# Patient Record
Sex: Female | Born: 1995 | Race: Black or African American | Hispanic: No | Marital: Single
Health system: Southern US, Community
[De-identification: ages and names within clinical notes are randomized; demographics above are authoritative.]

---

## 2019-04-20 ENCOUNTER — Emergency Department (HOSPITAL_COMMUNITY): Payer: Commercial Managed Care - PPO

## 2019-04-20 ENCOUNTER — Emergency Department (HOSPITAL_COMMUNITY)
Admission: EM | Admit: 2019-04-20 | Discharge: 2019-04-20 | Disposition: A | Discharge status: Home / Self Care | Payer: Commercial Managed Care - PPO | Attending: Emergency Medicine | Admitting: Emergency Medicine

## 2019-04-20 ENCOUNTER — Other Ambulatory Visit: Payer: Self-pay

## 2019-04-20 ENCOUNTER — Encounter (HOSPITAL_COMMUNITY): Payer: Self-pay | Admitting: Student

## 2019-04-20 DIAGNOSIS — R569 Unspecified convulsions: Secondary | ICD-10-CM | POA: Insufficient documentation

## 2019-04-20 LAB — CBC WITH DIFFERENTIAL/PLATELET
Abs Immature Granulocytes: 0 10*3/uL (ref 0.00–0.07)
Basophils Absolute: 0 10*3/uL (ref 0.0–0.1)
Basophils Relative: 1 %
Eosinophils Absolute: 0.2 10*3/uL (ref 0.0–0.5)
Eosinophils Relative: 4 %
HCT: 41.3 % (ref 36.0–46.0)
Hemoglobin: 12.6 g/dL (ref 12.0–15.0)
Lymphocytes Relative: 54 %
Lymphs Abs: 2.2 10*3/uL (ref 0.7–4.0)
MCH: 20.8 pg — ABNORMAL LOW (ref 26.0–34.0)
MCHC: 30.5 g/dL (ref 30.0–36.0)
MCV: 68 fL — ABNORMAL LOW (ref 80.0–100.0)
Monocytes Absolute: 0.2 10*3/uL (ref 0.1–1.0)
Monocytes Relative: 4 %
Neutro Abs: 1.5 10*3/uL — ABNORMAL LOW (ref 1.7–7.7)
Neutrophils Relative %: 37 %
Platelets: 255 10*3/uL (ref 150–400)
RBC: 6.07 MIL/uL — ABNORMAL HIGH (ref 3.87–5.11)
RDW: 14.6 % (ref 11.5–15.5)
WBC: 4.1 10*3/uL (ref 4.0–10.5)
nRBC: 0 % (ref 0.0–0.2)
nRBC: 0 /100 WBC

## 2019-04-20 LAB — COMPREHENSIVE METABOLIC PANEL
ALT: 14 U/L (ref 0–44)
AST: 15 U/L (ref 15–41)
Albumin: 3.8 g/dL (ref 3.5–5.0)
Alkaline Phosphatase: 39 U/L (ref 38–126)
Anion gap: 9 (ref 5–15)
BUN: 14 mg/dL (ref 6–20)
CO2: 25 mmol/L (ref 22–32)
Calcium: 9.2 mg/dL (ref 8.9–10.3)
Chloride: 107 mmol/L (ref 98–111)
Creatinine, Ser: 0.93 mg/dL (ref 0.44–1.00)
GFR calc Af Amer: >60 mL/min (ref >60)
GFR calc non Af Amer: >60 mL/min (ref >60)
Glucose, Bld: 107 mg/dL — ABNORMAL HIGH (ref 70–99)
Potassium: 3.4 mmol/L — ABNORMAL LOW (ref 3.5–5.1)
Sodium: 141 mmol/L (ref 135–145)
Total Bilirubin: 0.3 mg/dL (ref 0.3–1.2)
Total Protein: 6.3 g/dL — ABNORMAL LOW (ref 6.5–8.1)

## 2019-04-20 LAB — URINALYSIS, ROUTINE W REFLEX MICROSCOPIC
Bilirubin Urine: NEGATIVE
Glucose, UA: NEGATIVE mg/dL
Hgb urine dipstick: NEGATIVE
Ketones, ur: NEGATIVE mg/dL
Leukocytes,Ua: NEGATIVE
Nitrite: NEGATIVE
Protein, ur: NEGATIVE mg/dL
Specific Gravity, Urine: 1.014 (ref 1.005–1.030)
pH: 6 (ref 5.0–8.0)

## 2019-04-20 LAB — I-STAT BETA HCG BLOOD, ED (MC, WL, AP ONLY): I-stat hCG, quantitative: <5 m[IU]/mL (ref <5)

## 2019-04-20 LAB — RAPID URINE DRUG SCREEN, HOSP PERFORMED
Amphetamines: NOT DETECTED
Barbiturates: NOT DETECTED
Benzodiazepines: NOT DETECTED
Cocaine: NOT DETECTED
Opiates: NOT DETECTED
Tetrahydrocannabinol: NOT DETECTED

## 2019-04-20 LAB — LIPASE, BLOOD: Lipase: 25 U/L (ref 11–51)

## 2019-04-20 LAB — CBG MONITORING, ED: Glucose-Capillary: 97 mg/dL (ref 70–99)

## 2019-04-20 LAB — ETHANOL: Alcohol, Ethyl (B): <10 mg/dL (ref <10)

## 2019-04-20 MED ORDER — LEVETIRACETAM 500 MG PO TABS: 500.0000 mg | ORAL_TABLET | Freq: Two times a day (BID) | ORAL | 0 refills | Status: AC

## 2019-04-20 MED ORDER — SODIUM CHLORIDE 0.9 % IV BOLUS
1000.0000 mL | Freq: Once | INTRAVENOUS | Status: AC
  Administered 2019-04-20: 1000 mL via INTRAVENOUS

## 2019-04-20 MED ORDER — LEVETIRACETAM IN NACL 1000 MG/100ML IV SOLN
1000.0000 mg | Freq: Once | INTRAVENOUS | Status: AC
  Administered 2019-04-20: 1000 mg via INTRAVENOUS
  Filled 2019-04-20: qty 100

## 2019-04-20 NOTE — Discharge Instructions (Signed)
Follow-up with your neurosurgeon.  Do not drive a vehicle or operate heavy machinery until you are cleared by your neurosurgeon.  You need to see your primary care doctor and/or neurology likely for an EEG and MRI.  Hopefully her neurosurgeon can help set this up.

## 2019-04-20 NOTE — ED Notes (Signed)
Pt does have VP shunt

## 2019-04-20 NOTE — ED Notes (Signed)
Pt verbalized understanding of discharge instructions. Follow up care and prescriptions reviewed. Pt ambulated independently to lobby. 
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
## 2019-04-20 NOTE — ED Notes (Signed)
97 CBG on arrival
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
## 2021-06-26 IMAGING — CR DG SKULL 1-3V
2 series · 2 of 2 positions shown · non-contrast
Comparison: None.

CLINICAL DATA: Seizure.  Evaluate for shunt malfunction

EXAM:
SKULL - 1-3 VIEW

[skull calldwell]
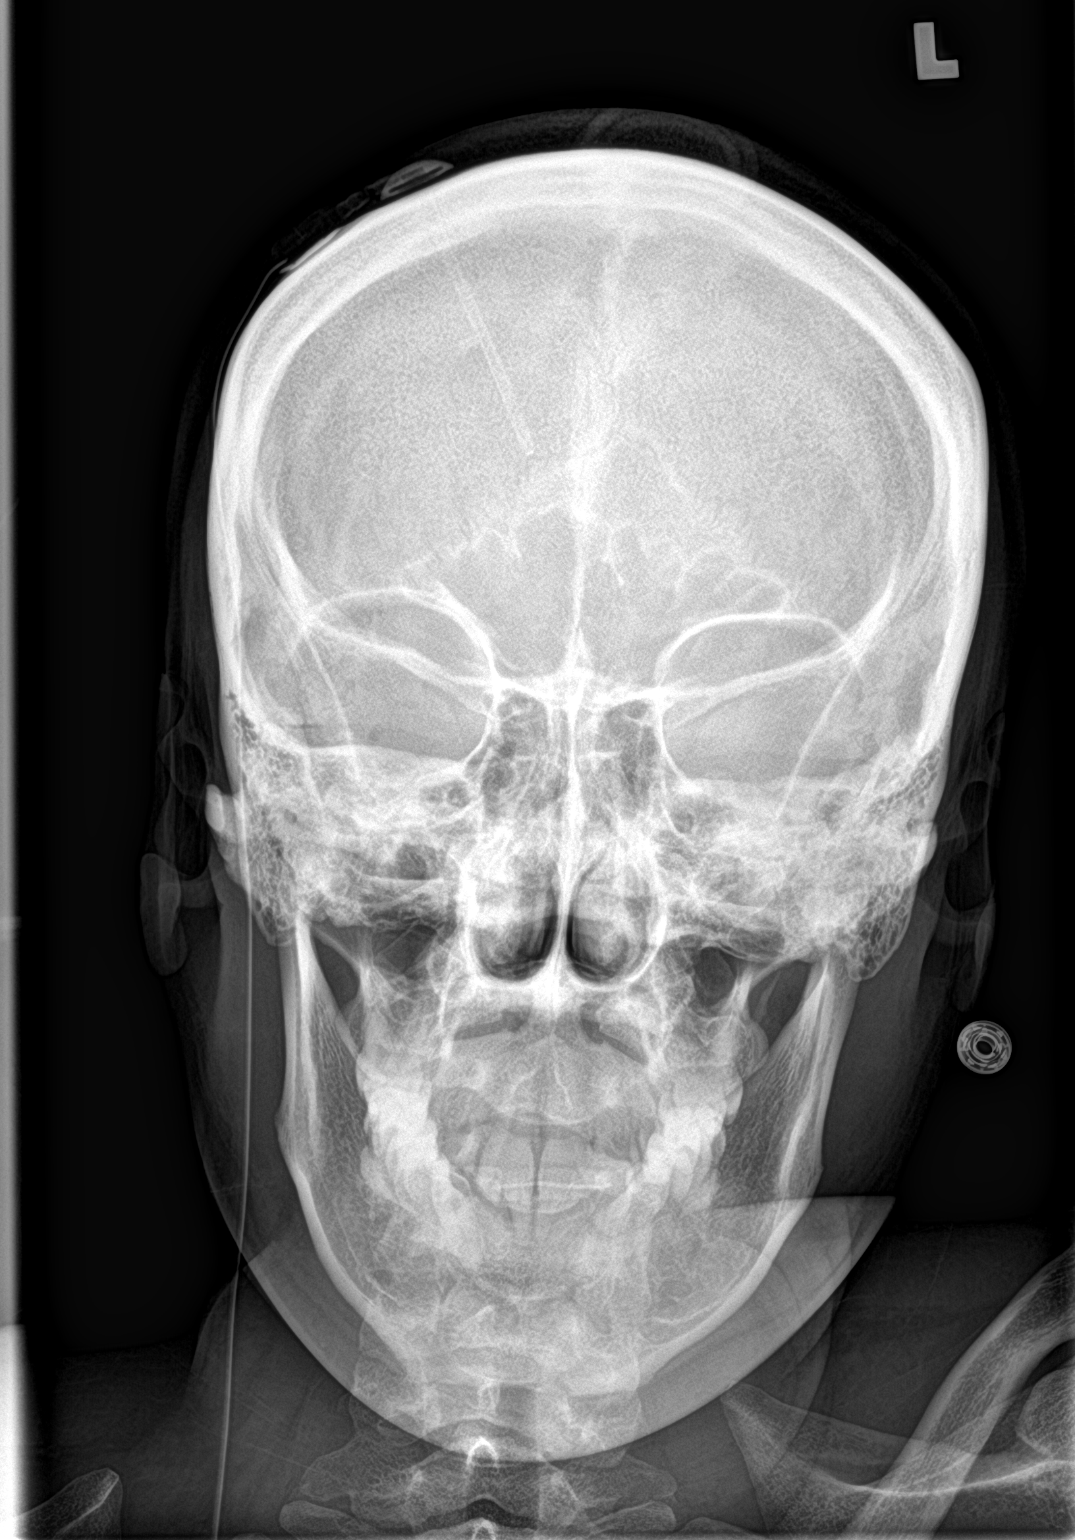

[skull lat]
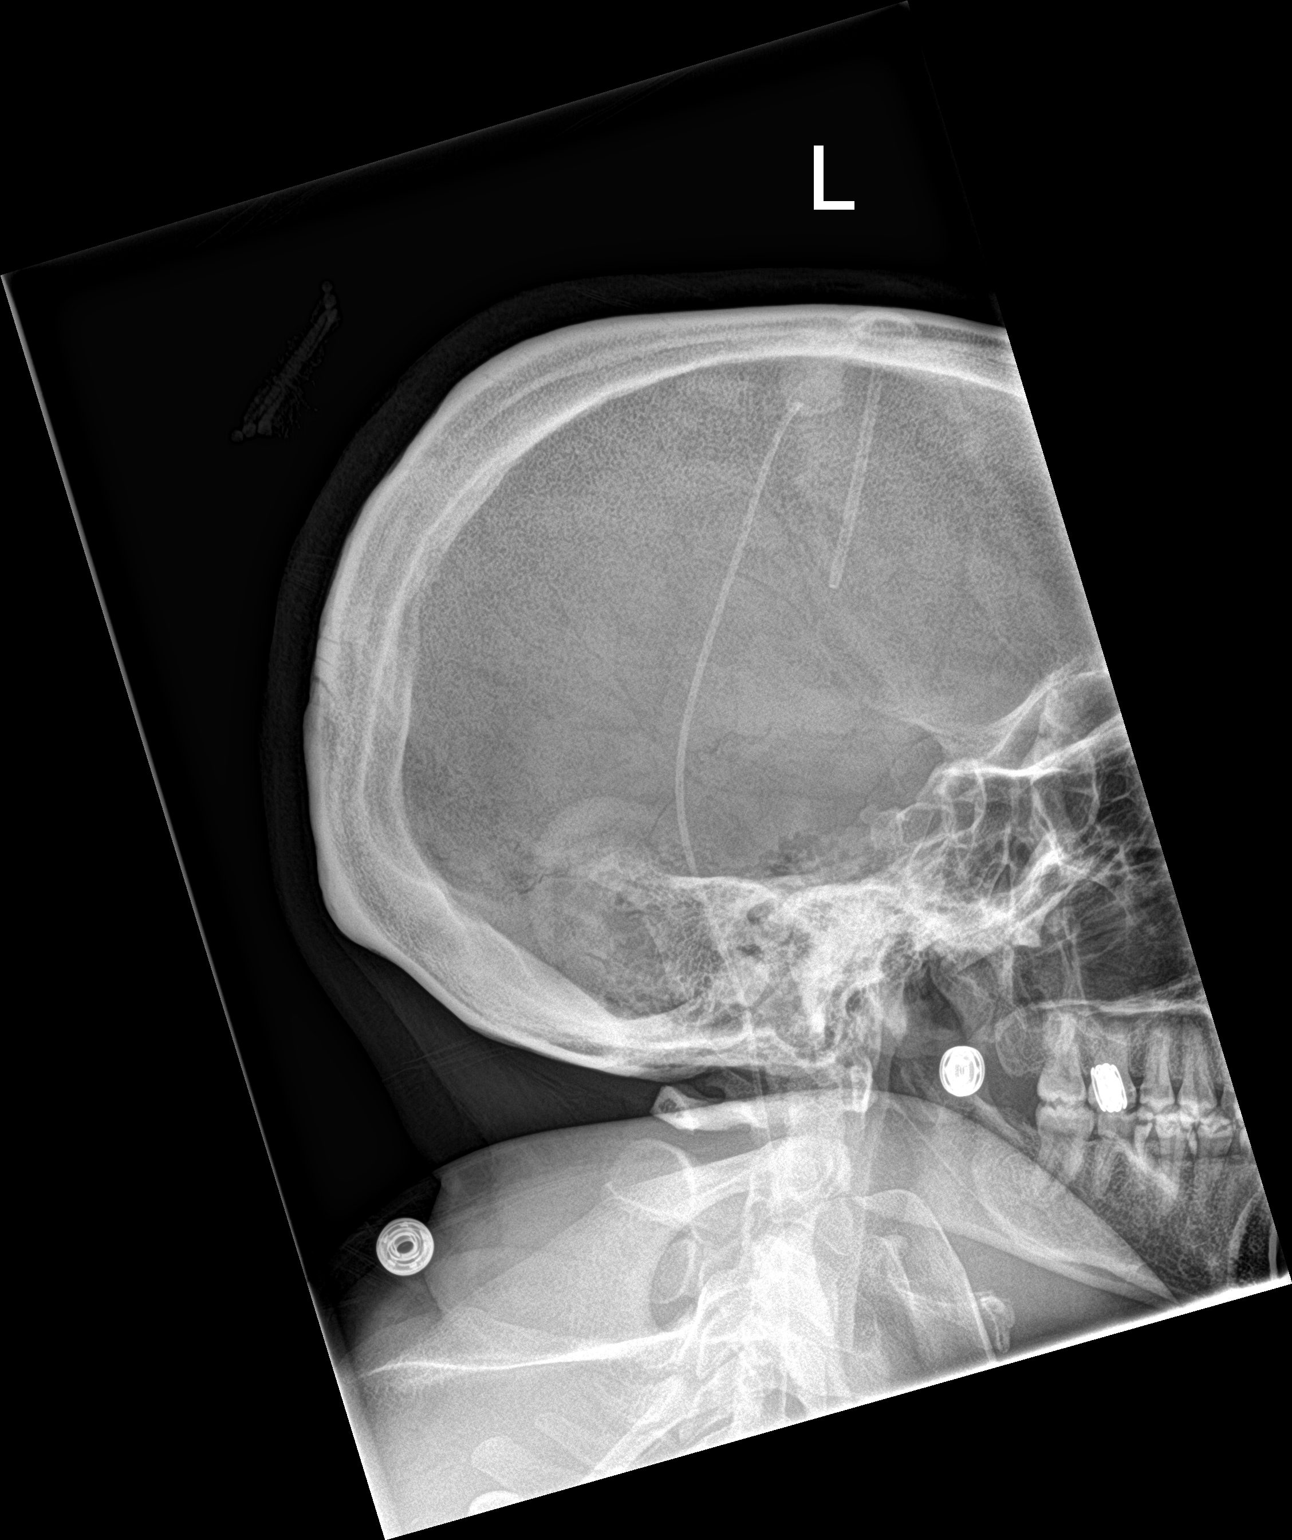

[2 of 2 positions shown; findings below may reference images not displayed]

FINDINGS: Ventriculostomy catheter from a RIGHT frontal approach. Continuous
catheter tubing extends in the RIGHT neck.
IMPRESSION: No shunt malfunction identified radiography.

## 2021-06-26 IMAGING — CT CT HEAD W/O CM
4 series · 16 of 47 positions shown, 18 images · non-contrast
Comparison: None.

CLINICAL DATA: Seizure.  Hypoglycemia

EXAM:
CT HEAD WITHOUT CONTRAST
TECHNIQUE: Contiguous axial images were obtained from the base of the skull
through the vertex without intravenous contrast.

[Series 3: head wo · axial · 0.44mm/px · z∈[-170,-44]mm · 7 of 35 slices shown, 9 images]
[im 5/35  brain]
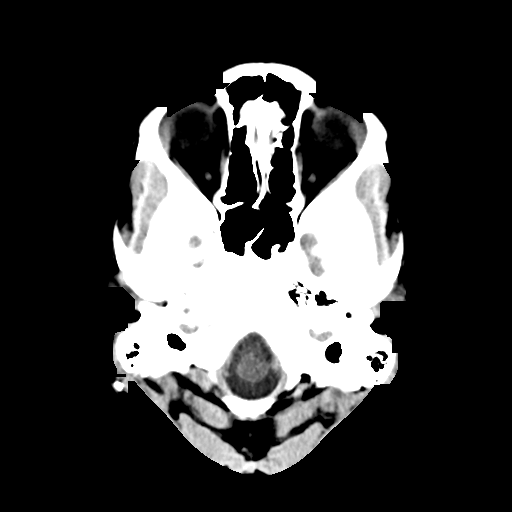
[im 5/35  bone]
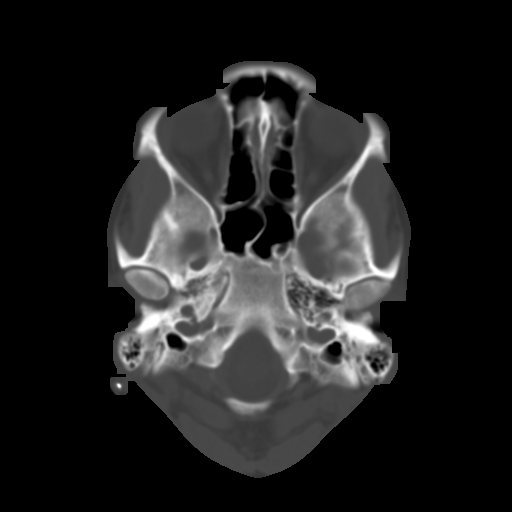
[im 9/35  brain]
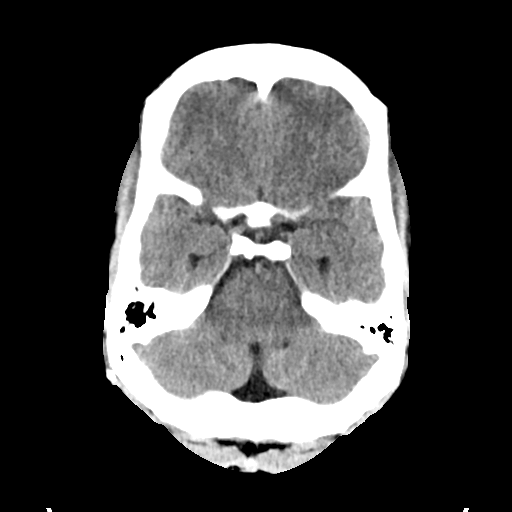
[im 13/35  brain]
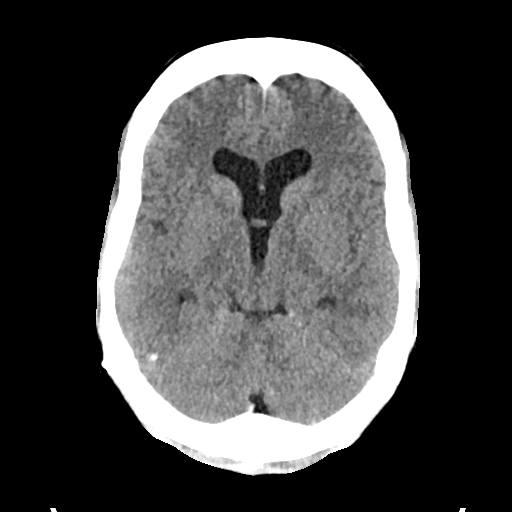
[im 18/35  brain]
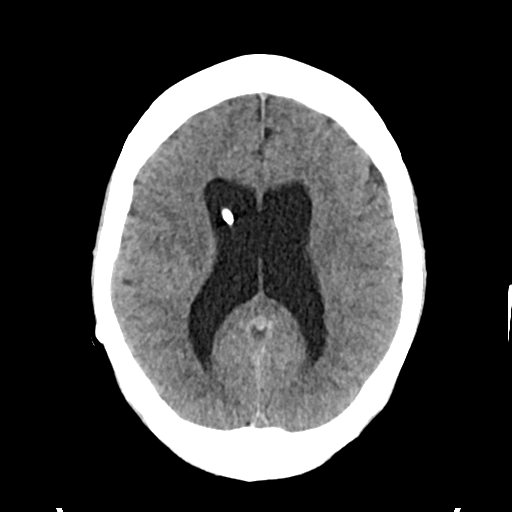
[im 22/35  brain]
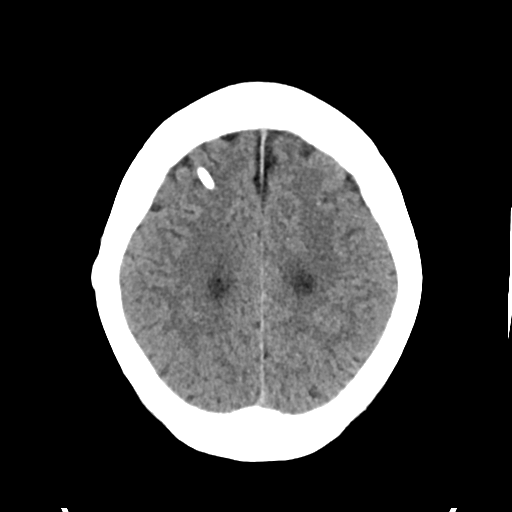
[im 22/35  bone]
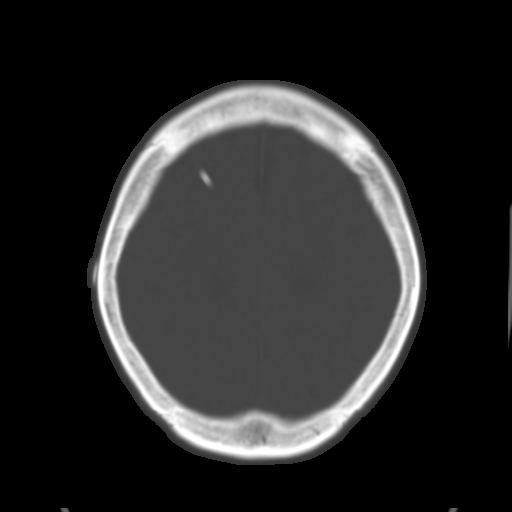
[im 26/35  brain]
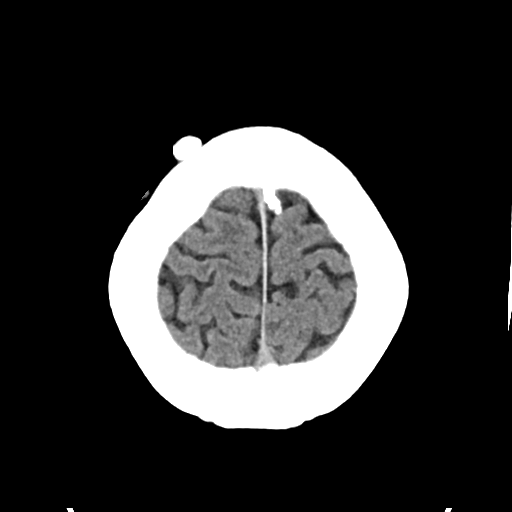
[im 30/35  brain]
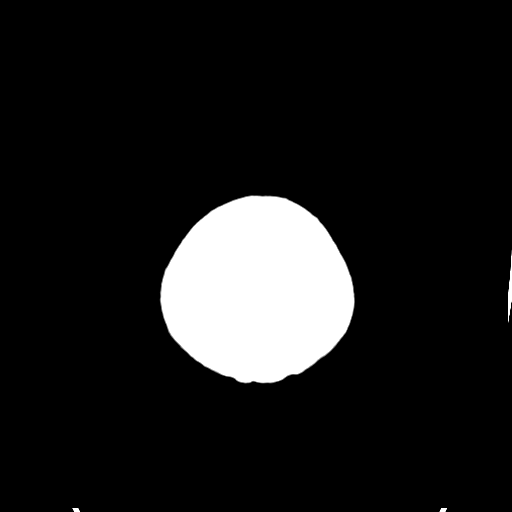

[Series 4: head bone · axial · 0.44mm/px · z∈[-174,-140]mm · 3 of 87 slices shown]
[im 9/87  bone]
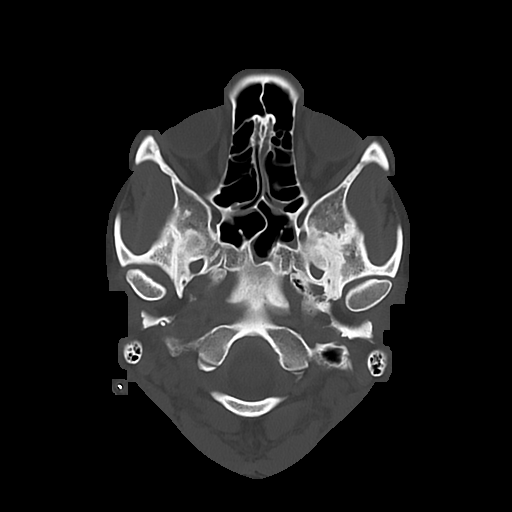
[im 18/87  bone]
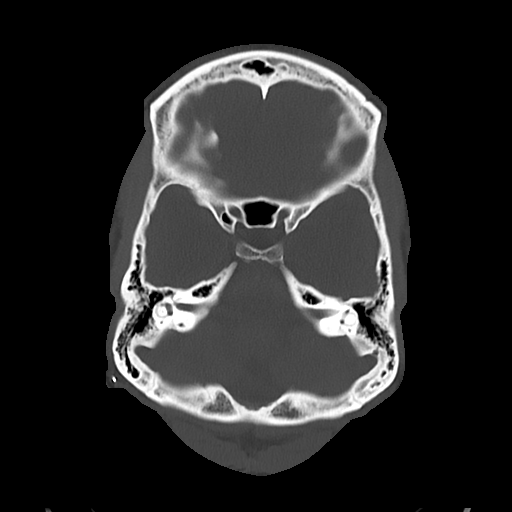
[im 26/87  bone]
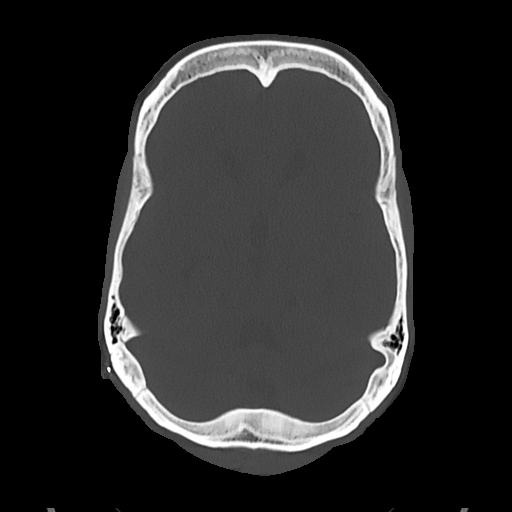

[Series 5: cor soft · coronal · 0.33mm/px · 3 of 69 slices shown]
[im 23/69  brain]
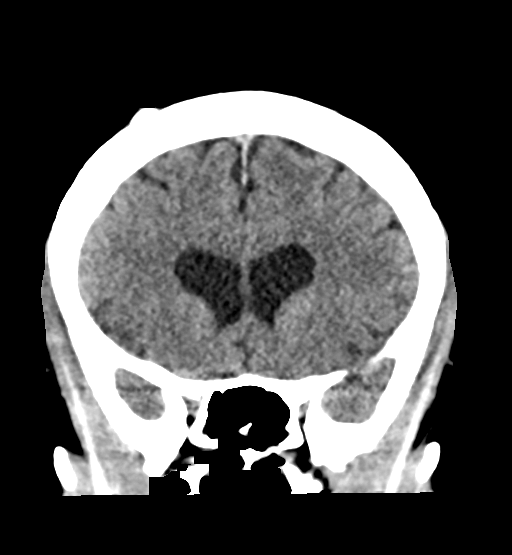
[im 31/69  brain]
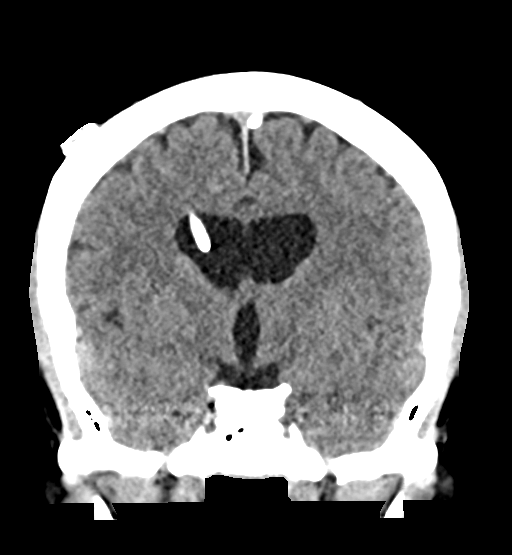
[im 38/69  brain]
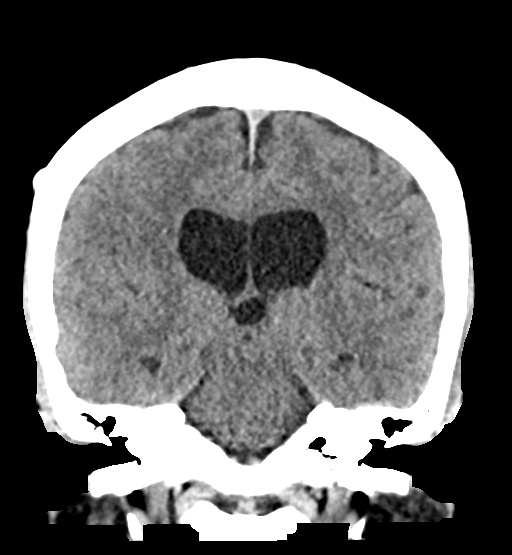

[Series 6: sag soft · sagittal · 0.36mm/px · 3 of 56 slices shown]
[im 19/56  brain]
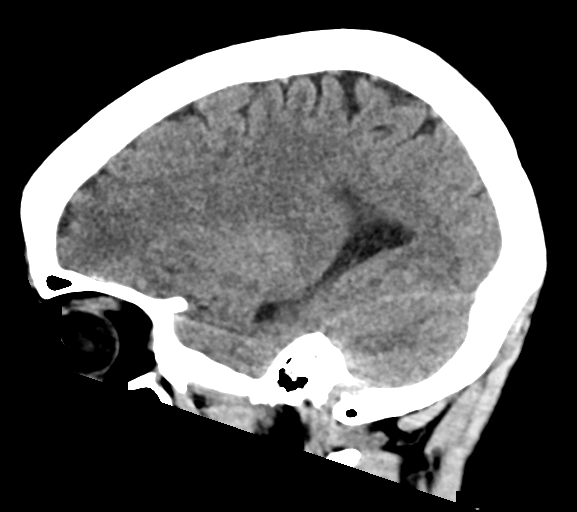
[im 28/56  brain]
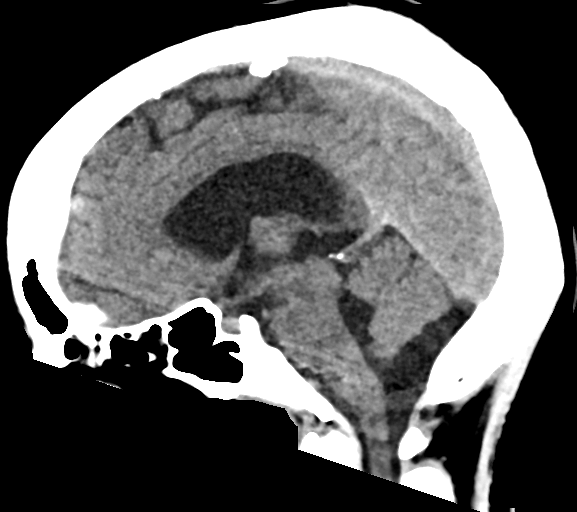
[im 37/56  brain]
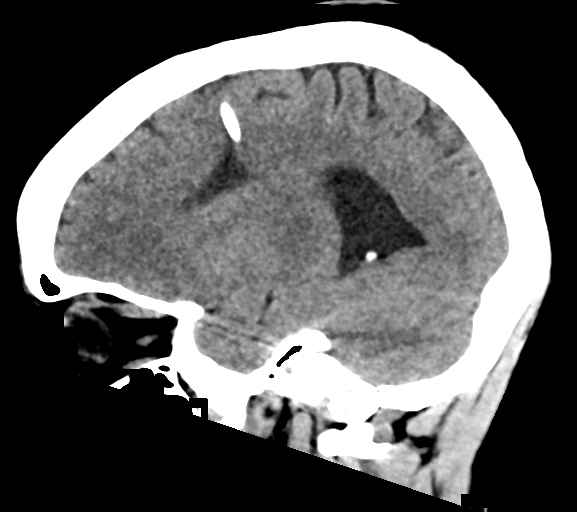

[16 of 47 positions shown; findings below may reference images not displayed]

FINDINGS: Brain: The ventricles are mildly enlarged with sulci normal in size.
There is a ventriculoperitoneal shunt catheter with the tip in the
right lateral ventricle anteriorly. Prominence of the cisterna magna
is an apparent anatomic variant. There appears to be hypoplasia of
the corpus callosum. There is no mass, hemorrhage, extra-axial fluid
collection, or midline shift. No acute infarct evident. Brain
parenchyma appears unremarkable except for apparent hypoplasia of
the corpus callosum.

Vascular: There is no hyperdense vessel. No vascular calcifications
are evident.

Skull: Bony defect in the right frontal bone for shunt placement
noted. Bony calvarium otherwise appears intact.

Sinuses/Orbits: There is mucosal thickening and opacification in
several ethmoid air cells. Other visualized paranasal sinuses are
clear. Visualized orbits appear symmetric bilaterally.

Other: Mastoid air cells are clear.
IMPRESSION: Ventricular prominence with shunt catheter present. Sulci appear
normal. Suspect hypoplasia of the corpus callosum. No evidence of
focal infarct. No mass or hemorrhage.

There is mucosal thickening and opacification in several ethmoid air
cells.
